# Patient Record
Sex: Female | Born: 2000 | Race: Black or African American | Hispanic: No | Marital: Single | Smoking: Never smoker
Health system: Southern US, Community
[De-identification: ages and names within clinical notes are randomized; demographics above are authoritative.]

---
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
## 2020-04-20 ENCOUNTER — Ambulatory Visit
Admission: EM | Admit: 2020-04-20 | Discharge: 2020-04-20 | Disposition: A | Discharge status: Home / Self Care | Payer: BC Managed Care – PPO | Attending: Physician Assistant | Admitting: Physician Assistant

## 2020-04-20 DIAGNOSIS — J0140 Acute pansinusitis, unspecified: Secondary | ICD-10-CM

## 2020-04-20 MED ORDER — IPRATROPIUM BROMIDE 0.06 % NA SOLN: 2.0000 | Freq: Four times a day (QID) | NASAL | 0 refills | Status: DC

## 2020-04-20 MED ORDER — DOXYCYCLINE HYCLATE 100 MG PO CAPS: 100.0000 mg | ORAL_CAPSULE | Freq: Two times a day (BID) | ORAL | 0 refills | Status: DC

## 2020-04-20 MED ORDER — BENZONATATE 100 MG PO CAPS: 100.0000 mg | ORAL_CAPSULE | Freq: Three times a day (TID) | ORAL | 0 refills | Status: DC

## 2020-04-20 MED ORDER — FLUTICASONE PROPIONATE 50 MCG/ACT NA SUSP: 2.0000 | Freq: Every day | NASAL | 0 refills | Status: DC

## 2020-04-20 NOTE — ED Provider Notes (Signed)
[facility]    CSN: [id number] Arrival date & time: 04/20/20  3154      History   Chief Complaint Chief Complaint  Patient presents with  . Cough    HPI Jocelyn Trevino is a 19 y.o. female.   19 year old female comes in for 3 week history of URI symptoms. Productive cough, congestion, post nasal drainage. Denies fever, chills, body aches. Has had some abdominal cramping that is intermittent.  Denies nausea, vomiting, diarrhea. Denies shortness of breath, loss of taste/smell. Negative COVID test 04/05/2020. otc cold medicine without relief.      History reviewed. No pertinent past medical history.  Patient Active Problem List   Diagnosis Date Noted  . Abnormal urinalysis 10/14/2019  . Encounter for general counseling and advice on contraceptive management 06/09/2018  . Sports physical 05/20/2017  . Health care maintenance 05/20/2017  . Sweaty armpits 05/29/2016  . R/O Fish malodor syndrome 05/29/2016    History reviewed. No pertinent surgical history.  OB History   No obstetric history on file.      Home Medications    Prior to Admission medications   Medication Sig Start Date End Date Taking? Authorizing Provider  benzonatate (TESSALON) 100 MG capsule Take 1 capsule (100 mg total) by mouth every 8 (eight) hours. 04/20/20   Cathie Hoops, Romello Hoehn V, PA-C  doxycycline (VIBRAMYCIN) 100 MG capsule Take 1 capsule (100 mg total) by mouth 2 (two) times daily. 04/20/20   Cathie Hoops, Anshi Jalloh V, PA-C  fluticasone (FLONASE) 50 MCG/ACT nasal spray Place 2 sprays into both nostrils daily. 04/20/20   Cathie Hoops, Maysen Sudol V, PA-C  ipratropium (ATROVENT) 0.06 % nasal spray Place 2 sprays into both nostrils 4 (four) times daily. 04/20/20   Belinda Fisher, PA-C    Family History Family History  Problem Relation Age of Onset  . Healthy Mother   . Healthy Father   . Healthy Sister     Social History Social History   Tobacco Use  . Smoking status: Never Smoker  . Smokeless tobacco: Never Used  Substance Use  Topics  . Alcohol use: No  . Drug use: No     Allergies   Patient has no known allergies.   Review of Systems Review of Systems  Reason unable to perform ROS: See HPI as above.     Physical Exam Triage Vital Signs ED Triage Vitals [04/20/20 0843]  Enc Vitals Group     BP 122/76     Pulse Rate 100     Resp 16     Temp 98.3 F (36.8 C)     Temp Source Oral     SpO2 97 %     Weight      Height      Head Circumference      Peak Flow      Pain Score 2     Pain Loc      Pain Edu?      Excl. in GC?    No data found.  Updated Vital Signs BP 122/76 (BP Location: Left Arm)   Pulse 100   Temp 98.3 F (36.8 C) (Oral)   Resp 16   LMP 04/15/2020   SpO2 97%   Physical Exam Constitutional:      General: She is not in acute distress.    Appearance: Normal appearance. She is not ill-appearing, toxic-appearing or diaphoretic.  HENT:     Head: Normocephalic and atraumatic.     Right Ear: Ear canal and  external ear normal. A middle ear effusion is present. Tympanic membrane is not erythematous or bulging.     Left Ear: Ear canal and external ear normal. A middle ear effusion is present. Tympanic membrane is not erythematous or bulging.     Nose:     Right Sinus: No maxillary sinus tenderness or frontal sinus tenderness.     Left Sinus: No maxillary sinus tenderness or frontal sinus tenderness.     Mouth/Throat:     Mouth: Mucous membranes are moist.     Pharynx: Oropharynx is clear. Uvula midline.  Cardiovascular:     Rate and Rhythm: Normal rate and regular rhythm.     Heart sounds: Normal heart sounds. No murmur. No friction rub. No gallop.   Pulmonary:     Effort: Pulmonary effort is normal. No accessory muscle usage, prolonged expiration, respiratory distress or retractions.     Comments: Lungs clear to auscultation without adventitious lung sounds. Musculoskeletal:     Cervical back: Normal range of motion and neck supple.  Neurological:     General: No focal  deficit present.     Mental Status: She is alert and oriented to person, place, and time.      UC Treatments / Results  Labs (all labs ordered are listed, but only abnormal results are displayed) Labs Reviewed - No data to display  EKG   Radiology No results found.  Procedures Procedures (including critical care time)  Medications Ordered in UC Medications - No data to display  Initial Impression / Assessment and Plan / UC Course  I have reviewed the triage vital signs and the nursing notes.  Pertinent labs & imaging results that were available during my care of the patient were reviewed by me and considered in my medical decision making (see chart for details).    Will cover for sinusitis/bronchitis with doxycycline. Other symptomatic treatment discussed. If symptoms still not improving, may need prednisone. Return precautions given. Patient expresses understanding and agrees to plan.  Final Clinical Impressions(s) / UC Diagnoses   Final diagnoses:  Acute non-recurrent pansinusitis   ED Prescriptions    Medication Sig Dispense Auth. Provider   doxycycline (VIBRAMYCIN) 100 MG capsule Take 1 capsule (100 mg total) by mouth 2 (two) times daily. 14 capsule Cletus Paris V, PA-C   fluticasone (FLONASE) 50 MCG/ACT nasal spray Place 2 sprays into both nostrils daily. 1 g Jaz Laningham V, PA-C   ipratropium (ATROVENT) 0.06 % nasal spray Place 2 sprays into both nostrils 4 (four) times daily. 15 mL Glendell Schlottman V, PA-C   benzonatate (TESSALON) 100 MG capsule Take 1 capsule (100 mg total) by mouth every 8 (eight) hours. 21 capsule Ok Edwards, PA-C     PDMP not reviewed this encounter.   Ok Edwards, PA-C 04/20/20 [phone]

## 2020-04-20 NOTE — Discharge Instructions (Signed)
Start doxycycline as directed. Tessalon for cough. Start flonase, atrovent nasal spray for nasal congestion/drainage. You can use over the counter nasal saline rinse such as neti pot for nasal congestion. Keep hydrated, your urine should be clear to pale yellow in color. Tylenol/motrin for fever and pain. Monitor for any worsening of symptoms, chest pain, shortness of breath, wheezing, swelling of the throat, go to the emergency department for further evaluation needed.

## 2020-04-20 NOTE — ED Triage Notes (Signed)
Pt c/o productive cough with green sputum and some nasal congestion x3wks. States had a neg covid test a week and a half ago.

## 2020-06-07 ENCOUNTER — Telehealth: Payer: Self-pay | Admitting: Physician Assistant

## 2020-06-07 NOTE — Telephone Encounter (Signed)
Patient received Meningitis B 11/2019. I have updated [facility] and printed copy for patient to pick up. AS, CMA

## 2020-06-07 NOTE — Telephone Encounter (Signed)
Client Name (First - [place] - Last) DOB Gender [facility] Tracking Schedule Chart #  OCEAN KEARLEY 08/05/2001 Lauretta Chester ACIP     Address  [address], [place], [place] [address] [phone]    Comments        History            Vaccine Group Date Administered Series Trade Name (Vaccine) Dose Owned? Reaction Hist? Edit  DTP/aP   12/18/2001      1 of 5      No   Yes       02/11/2002      2 of 5      No   Yes       04/01/2002      3 of 5      No   Yes       04/01/2003      4 of 5      No   Yes       10/24/2005      5 of 5 [employer] Full  No       HepA   07/15/2014      1 of 2 VAQTA-Peds 2 Dose Full  No       HepB   09/07/01      1 of 3      No   Yes       11/10/2001      2 of 3      No   Yes       07/07/2002      3 of 3      No   Yes   Hib   12/18/2001      1 of 4      No   Yes       02/11/2002      2 of 4      No   Yes       04/01/2002      3 of 4      No   Yes       04/01/2003      4 of 4      No   Yes   HPV   07/15/2014      1 of 2 Gardasil Full  No       Influenza   01/19/2013      1 of 1 Fluvirin Full  No       Meningo   07/15/2014      1 of 2 Menveo Full  No       MMR   04/01/2003      1 of 2 MMR II    No   Yes       10/24/2005      2 of 2 MMR II Full  No       PneumoConjugate   12/18/2001      1 of 4 Prevnar 7    No   Yes       02/11/2002      2 of 4 Prevnar 7    No   Yes       04/01/2002      3 of 4 Prevnar 7    No   Yes   Polio   12/18/2001      1 of 5 IPOL    No   Yes       02/11/2002      2  of 5 IPOL    No   Yes       07/07/2002      3 of 5 IPOL    No   Yes       07/11/2002      4 of 5 IPOL    No   Yes       10/24/2005      5 of 5 IPOL Full  No       Td   01/19/2013      Booster Adacel Full  No       Tdap/Pertussis   01/19/2013      1 of 1 Adacel Full  No       Varicella   04/01/2003      1 of 2 Varivax    No   Yes       10/24/2005      2 of 2 Varivax Full  No           Current Age: 60 years, 8 months     Vaccines Recommended by Selected Tracking Schedule     Select Vaccine Group Vaccine  Earliest Date Recommended Date Overdue Date Latest Date    DTP/aP  Complete   '[x]'$   HepA   01/15/2015  01/15/2015  02/14/2015        HepB  Complete     Hib  Complete   '[x]'$   HPV   12/15/2014  01/15/2015  08/16/2015  10/06/2029   '[x]'$   Influenza   02/16/2013  01/19/2014  07/19/2014      '[x]'$   Meningo   10/07/2017  07/16/2019  10/08/2019  10/07/2023     MMR  Complete     PneumoConjugate  Maximum Age Exceeded     Polio  Complete   '[]'$   Td   01/19/2018  01/19/2023  03/20/2023        Tdap/Pertussis  Complete     Varicella  Complete     Category B* Recommendation      Select Vaccine Group Vaccine  Earliest Date Recommended Date Overdue Date Latest Date  '[x]'$                Per [facility] patient is in need of Meningitis B vaccine. Left message for patient to notify as requested. Advised patient to call to schedule apt. AS, CMA

## 2020-06-07 NOTE — Telephone Encounter (Signed)
Patient called asking to make sure she is caught up on all immunizations,specifically the meningococcal vaccine. She will be at work all day and is unable to answer the phone, she is requesting the answer be left on her private VM at [phone].

## 2021-05-08 ENCOUNTER — Encounter: Payer: Self-pay | Admitting: Nurse Practitioner

## 2021-05-08 ENCOUNTER — Ambulatory Visit (INDEPENDENT_AMBULATORY_CARE_PROVIDER_SITE_OTHER): Payer: BC Managed Care – PPO | Admitting: Nurse Practitioner

## 2021-05-08 VITALS — Ht 67.0 in | Wt 220.0 lb

## 2021-05-08 DIAGNOSIS — F4010 Social phobia, unspecified: Secondary | ICD-10-CM

## 2021-05-08 DIAGNOSIS — R61 Generalized hyperhidrosis: Secondary | ICD-10-CM

## 2021-05-08 NOTE — Patient Instructions (Signed)
Social Anxiety Disorder, Adult Social anxiety disorder (SAD), previously called social phobia, is a mental health condition. People with SAD often feel nervous, afraid, or embarrassed when they are around other people in social situations. They worry that other people are judging or criticizing them for how they look, what they say, or how they act. SAD involves more than just feeling shy or self-conscious at times. It can cause severe emotional distress. It can interfere with activities of daily life. SAD also may lead to alcohol or drug use, and even suicide. SAD is a common mental health condition. It can develop at any time, but it usually starts in the teenage years. What are the causes? The cause of this condition is not known. It may involve genes that are passed through families. Stressful events may trigger anxiety. This disorder is also associated with an overactive amygdala. The amygdala is the part of the brain that triggers your response to strong feelings, such as fear. What increases the risk? This condition is more likely to develop in:  People who have a family history of anxiety disorders.  Women.  People who have a physical or behavioral condition that makes them feel self-conscious or nervous, such as a stutter or a long-term (chronic) disease. What are the signs or symptoms? The main symptom of this condition is fear of embarrassment caused by being criticized or judged in social situations. You may be afraid to:  Speak in public.  Go shopping.  Use a public bathroom.  Eat at a restaurant.  Go to work.  Interact with people you do not know. Extreme fear and anxiety may cause physical symptoms, including:  Blushing.  A fast heartbeat.  Sweating.  Shaky hands or voice.  Confusion.  Light-headedness.  Upset stomach, diarrhea, or vomiting.  Shortness of breath. How is this diagnosed? This condition is diagnosed based on your history, symptoms, and  behavior in social situations. You may be diagnosed with this type of anxiety if your symptoms have lasted for more than 6 months and have been present on more days than not. Your health care provider may ask you about your use of alcohol, drugs, and prescription medicines. He or she may also refer you to a mental health specialist for further evaluation or treatment. How is this treated? Treatment for this condition may include:  Cognitive behavioral therapy (CBT). This type of talk therapy helps you learn to replace negative thoughts and behaviors with positive ones. This may include learning how to use self-calming skills and other methods of managing your anxiety.  Exposure therapy. You will be exposed to social situations that cause you fear. The treatment starts with practicing self-calming in situations that cause you low levels of fear. Over time, you will progress by sustaining self-calming and managing harder situations.  Antidepressant medicines. These medicines may be used by themselves or in addition to other therapies.  Biofeedback. This process trains you to manage your body's response (physiological response) through breathing techniques and relaxation methods. You will work with a therapist while machines are used to monitor your physical symptoms.  Techniques for relaxation and managing anxiety. These include deep breathing, self-talk, meditation, visual imagery, muscle relaxation, music therapy, and yoga. These techniques are often used with other therapies to keep you calm in situations that cause you anxiety. These treatments are often used in combination.   Follow these instructions at home: Alcohol use If you drink alcohol:  Limit how much you use to: ? 0-1 drink a  day for nonpregnant women. ? 0-2 drinks a day for men.  Be aware of how much alcohol is in your drink. In the [place] one drink equals one 12 oz bottle of beer (355 mL), one 5 oz glass of wine (148 mL), or one  1 oz glass of hard liquor (44 mL). General instructions  Take over-the-counter and prescription medicines only as told by your health care provider.  Practice techniques for relaxation and managing anxiety at times you are not challenged by social anxiety.  Return to social activities using techniques you have learned, as you feel ready to do so.  Avoid caffeine and certain over-the-counter cold medicines. These may make you feel worse. Ask your pharmacists which medicines to avoid.  Keep all follow-up visits as told by your health care provider. This is important. Where to find more information  The [employer] Mental [employer]ness (NAMI): https://www.nami.org  Social Anxiety Association: https://socialphobia.org  Mental Health [facility]): https://www.stevens-henderson.com/  Anxiety and Depression Association of [place] (ADAA): http://miller-hamilton.net/ Contact a health care provider if:  Your symptoms do not improve or get worse.  You have signs of depression, such as: ? Persistent sadness or moodiness. ? Loss of enjoyment in activities that used to bring you joy. ? Change in weight or eating. ? Changes in sleeping habits. ? Avoiding friends or family members more than usual. ? Loss of energy for normal tasks. ? Feeling guilty or worthless.  You become more isolated than you normally are.  You find it more and more difficult to speak or interact with others.  You are using drugs.  You are drinking more alcohol than usual. Get help right away if:  You harm yourself.  You have suicidal thoughts. If you ever feel like you may hurt yourself or others, or have thoughts about taking your own life, get help right away. You can go to your nearest emergency department or call:  Your local emergency services (911 in the [place]  A suicide crisis helpline, such as the National Suicide Prevention Lifeline at 715-505-9922. This is open 24 hours a day. Summary  Social anxiety disorder  (SAD) may cause you to feel nervous, afraid, or embarrassed when you are around other people in social situations.  SAD is a common mental disorder. It can develop at any time, but it usually starts in the teenage years.  Treatment includes talk therapy, exposure therapy, medicines, biofeedback, and relaxation techniques. It can involve a combination of treatments. This information is not intended to replace advice given to you by your health care provider. Make sure you discuss any questions you have with your health care provider. Document Revised: 05/06/2019 Document Reviewed: 05/06/2019 Elsevier Patient Education  2021 [employer].

## 2021-05-08 NOTE — Progress Notes (Signed)
Virtual Visit via Telephone Note  I connected with Jocelyn Trevino on 05/08/21 at  1:50 PM EDT by telephone and verified that I am speaking with the correct person using two identifiers.  Location: Patient: home Provider: home of provider   I discussed the limitations, risks, security and privacy concerns of performing an evaluation and management service by telephone and the availability of in person appointments. I also discussed with the patient that there may be a patient responsible charge related to this service. The patient expressed understanding and agreed to proceed.   History of Present Illness: The patient states that she has been having trouble with anxiety for several years. She states that she has really noted it during her freshman year at college. She goes to [employer]. She states that this is more severe when she is going to be around crowds of people she states that she starts to get very sweaty. She states that she has to physically remove herself from situations where there will be a lot of people around. She states that sometimes, she won't even attend. She states that she feels restless and hyper aware of her surroundings when she gets anxious. She denies chest pain, palpitations, tightness of the chest, or shortness of breath. She denies nausea, vomiting, or changes in bowel or bladder habits.    Observations/Objective:  The patient is alert and oriented. She is pleasant and answers all questions appropriately. Breathing is non-labored. She is in no acute distress at this time.   Assessment and Plan:  1. Social anxiety disorder Discussed different ways to deal with anxiety. She is already employing methods to lower anxiety such as deep breathing and removing herself from situations which case increased anxiety. She would like to have referral to counseling services for further evaluation. Will consider medication therapy if counseling services do not work.  -  Ambulatory referral to Psychology  2. Excessive sweating This is symptom she deals with routinely, but worse during times of increased anxiety. Will consider lab testing if no improvement at next visit.   Follow Up Instructions:    I discussed the assessment and treatment plan with the patient. The patient was provided an opportunity to ask questions and all were answered. The patient agreed with the plan and demonstrated an understanding of the instructions.   The patient was advised to call back or seek an in-person evaluation if the symptoms worsen or if the condition fails to improve as anticipated.  I provided 22 minutes of non-face-to-face time during this encounter.   Carlean Jews, NP

## 2021-06-13 ENCOUNTER — Ambulatory Visit: Payer: BC Managed Care – PPO | Admitting: Psychologist

## 2021-06-20 ENCOUNTER — Ambulatory Visit: Payer: BC Managed Care – PPO | Admitting: Psychologist

## 2021-06-29 ENCOUNTER — Telehealth: Payer: Self-pay | Admitting: Nurse Practitioner

## 2021-06-29 NOTE — Telephone Encounter (Signed)
Patient had a belly button piercing and it ripped out. It bled a bit but not bad. It is not hurting. Please advise, thanks.

## 2021-07-04 ENCOUNTER — Ambulatory Visit: Payer: Self-pay | Admitting: Nurse Practitioner

## 2022-01-30 ENCOUNTER — Encounter: Payer: Self-pay | Admitting: Nurse Practitioner

## 2024-08-27 ENCOUNTER — Ambulatory Visit: Payer: Self-pay
